# Patient Record
Sex: Male | Born: 1990 | Race: White | Hispanic: No | Marital: Single | Smoking: Current every day smoker
Health system: Southern US, Community
[De-identification: ages and names within clinical notes are randomized; demographics above are authoritative.]

---

## 2011-02-11 DIAGNOSIS — H409 Unspecified glaucoma: Secondary | ICD-10-CM | POA: Insufficient documentation

## 2011-02-11 DIAGNOSIS — R42 Dizziness and giddiness: Secondary | ICD-10-CM | POA: Insufficient documentation

## 2011-02-11 DIAGNOSIS — R51 Headache: Secondary | ICD-10-CM | POA: Insufficient documentation

## 2011-02-11 DIAGNOSIS — H5710 Ocular pain, unspecified eye: Secondary | ICD-10-CM | POA: Insufficient documentation

## 2011-02-11 DIAGNOSIS — H5460 Unqualified visual loss, one eye, unspecified: Secondary | ICD-10-CM | POA: Insufficient documentation

## 2011-02-12 ENCOUNTER — Other Ambulatory Visit: Payer: Self-pay

## 2011-02-12 ENCOUNTER — Emergency Department (HOSPITAL_COMMUNITY)
Admission: EM | Admit: 2011-02-12 | Discharge: 2011-02-12 | Disposition: A | Discharge status: Home / Self Care | Payer: Self-pay | Attending: Emergency Medicine | Admitting: Emergency Medicine

## 2011-02-12 ENCOUNTER — Emergency Department (HOSPITAL_COMMUNITY): Payer: Self-pay

## 2011-02-12 ENCOUNTER — Encounter: Payer: Self-pay | Admitting: *Deleted

## 2011-02-12 DIAGNOSIS — R51 Headache: Secondary | ICD-10-CM

## 2011-02-12 DIAGNOSIS — H547 Unspecified visual loss: Secondary | ICD-10-CM

## 2011-02-12 LAB — URINALYSIS, ROUTINE W REFLEX MICROSCOPIC
Bilirubin Urine: NEGATIVE
Leukocytes, UA: NEGATIVE
Nitrite: NEGATIVE
Specific Gravity, Urine: 1.028 (ref 1.005–1.030)
Urobilinogen, UA: 1 mg/dL (ref 0.0–1.0)
pH: 7 (ref 5.0–8.0)

## 2011-02-12 LAB — POCT I-STAT, CHEM 8
Chloride: 105 mEq/L (ref 96–112)
Glucose, Bld: 102 mg/dL — ABNORMAL HIGH (ref 70–99)
HCT: 46 % (ref 39.0–52.0)
Hemoglobin: 15.6 g/dL (ref 13.0–17.0)
Potassium: 4 mEq/L (ref 3.5–5.1)
Sodium: 143 mEq/L (ref 135–145)

## 2011-02-12 LAB — CBC
HCT: 43.6 % (ref 39.0–52.0)
Hemoglobin: 15.4 g/dL (ref 13.0–17.0)
RDW: 12.5 % (ref 11.5–15.5)
WBC: 6.9 10*3/uL (ref 4.0–10.5)

## 2011-02-12 LAB — DIFFERENTIAL
Basophils Absolute: 0 10*3/uL (ref 0.0–0.1)
Basophils Relative: 0 % (ref 0–1)
Lymphocytes Relative: 38 % (ref 12–46)
Monocytes Absolute: 0.6 10*3/uL (ref 0.1–1.0)
Monocytes Relative: 8 % (ref 3–12)
Neutro Abs: 3.5 10*3/uL (ref 1.7–7.7)
Neutrophils Relative %: 51 % (ref 43–77)

## 2011-02-12 MED ORDER — NAPROXEN 500 MG PO TABS: 500.0000 mg | ORAL_TABLET | Freq: Two times a day (BID) | ORAL | Status: DC

## 2011-02-12 NOTE — ED Provider Notes (Signed)
History     CSN: [id number] Arrival date & time: 02/12/2011 12:27 AM   First MD Initiated Contact with Patient 02/12/11 0042      Chief Complaint  Patient presents with  . 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  Patient is a 20 y.o. male presenting with eye pain.  Eye Pain This is a recurrent problem. Episode onset: 1 week ago. The problem occurs daily. The problem has not changed since onset.Associated symptoms include headaches. Pertinent negatives include no chest pain, no abdominal pain and no shortness of breath. Associated symptoms comments: Vertigo spells and headaches.. Exacerbated by: being around noises and lots of people. Relieved by: being isolated by self. He has tried nothing for the symptoms.    Past Medical History  Diagnosis Date  . Glaucoma     History reviewed. No pertinent past surgical history.  History reviewed. No pertinent family history.  History  Substance Use Topics  . Smoking status: Current Everyday Smoker  . Smokeless tobacco: Not on file  . Alcohol Use: No      Review of Systems  Eyes: Positive for pain.  Respiratory: Negative for shortness of breath.   Cardiovascular: Negative for chest pain.  Gastrointestinal: Negative for abdominal pain.  Neurological: Positive for headaches.  All other systems reviewed and are negative.    Allergies  Review of patient's allergies indicates no known allergies.  Home Medications   Current Outpatient Rx  Name Route Sig Dispense  Refill  . NAPROXEN 500 MG PO TABS Oral Take 1 tablet (500 mg total) by mouth 2 (two) times daily. 30 tablet 0    BP 112/66  Pulse 58  Temp(Src) 98 F (36.7 C) (Oral)  Resp 16  SpO2 98%  Physical Exam  Nursing note and vitals reviewed. Constitutional: He appears well-developed and well-nourished. No distress.  HENT:  Head: Normocephalic and atraumatic.  Mouth/Throat: Oropharynx is clear and moist. No oropharyngeal exudate.  Eyes: Right eye exhibits no discharge. Left eye exhibits no discharge. No scleral icterus.       R eye sclerotic and hypotrophied, L eye with normal appearnce, no conj injection, normal reactive pupil, normal EOM.  IOP is 13 X 3  Neck: Normal range of motion. Neck supple. No JVD present. No thyromegaly present.  Cardiovascular: Normal rate, regular rhythm, normal heart sounds and intact distal pulses.  Exam reveals no gallop and no friction rub.   No murmur heard. Pulmonary/Chest: Effort normal and breath sounds normal. No respiratory distress. He has no wheezes. He has no rales.  Abdominal: Soft. Bowel sounds are normal. He exhibits no distension and no mass. There is no tenderness.  Musculoskeletal: Normal range of motion. He exhibits no edema and no tenderness.  Lymphadenopathy:    He has no cervical adenopathy.  Neurological: He is alert. Coordination normal.  Skin: Skin is warm and dry. No rash noted. No erythema.  Psychiatric: He has a normal mood and affect. His behavior is normal.    ED Course  Procedures (  including critical care time)  Labs Reviewed  URINALYSIS, ROUTINE W REFLEX MICROSCOPIC - Abnormal; Notable for the following:    Appearance CLOUDY (*)    Ketones, ur 15 (*)    All other components within normal limits  POCT I-STAT, CHEM 8 - Abnormal; Notable for the following:    Glucose, Bld 102 (*)    All other components within normal limits  CBC  DIFFERENTIAL  I-STAT, CHEM 8   Ct Head Wo Contrast  02/12/2011  *RADIOLOGY REPORT*   Clinical Data: Persistent left eye pain.  CT HEAD WITHOUT CONTRAST  Technique:  Contiguous axial images were obtained from the base of the skull through the vertex without contrast.  Comparison: None.  Findings: There is no evidence of acute infarction, mass lesion, or intra- or extra-axial hemorrhage on CT.  The posterior fossa, including the cerebellum, brainstem and fourth ventricle, is within normal limits.  The third and lateral ventricles, and basal ganglia are unremarkable in appearance.  The cerebral hemispheres are symmetric in appearance, with normal gray- white differentiation.  No mass effect or midline shift is seen.  There is no evidence of fracture; visualized osseous structures are unremarkable in appearance.  Partial right-sided phthisis bulbi is noted.  The visualized portions of the left orbit are unremarkable. The paranasal sinuses and mastoid air cells are well-aerated.  No significant soft tissue abnormalities are seen.  IMPRESSION:  1.  No acute intracranial pathology seen. 2.  Partial right-sided phthisis bulbi noted; the visualized portions of the left orbit are unremarkable.  Original Report Authenticated By: Tonia Ghent, M.D.     1. Headache   2. Visual loss       MDM  Overall pt is well appearing, has normal IOP but HA and blackouts need w/u with CT head and labs.  Possibly related to anxiety and social situation.  Some vertigo per his report of spinning room and loss of balance intermittentlyl.      ED ECG REPORT   Date: 02/12/2011   Rate: 71   Rhythm: normal sinus rhythm  QRS Axis: normal  Intervals: normal  ST/T Wave abnormalities: nonspecific ST changes likely early repolarization  Conduction Disutrbances:none  Narrative Interpretation: unremarkable  Old EKG Reviewed: none available  Pt informed of results - no acute findings on CT and has normal vision with corrective lenses, no signs of acute glaucoma, no signs of stroke, well appearing otehrwise.   Optho f/u encouraged.  Vida Roller, MD 02/12/11 [phone]

## 2011-02-12 NOTE — ED Notes (Addendum)
[facility] from [facility], C/o L eye pain, onset ~ 2 weeks ago, also reports dizzy spells, vision black outs, floaters. Lost vision in R eye ~ 4 yrs ago, L eye sx are worse than R eye sx were then. Mild redness noted to L eye, pupil round reactive 5mm brisk. (R eye inflamed, drooping lid w/ possible drainage).   L eye pain 6/10.
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
## 2011-06-29 ENCOUNTER — Emergency Department (HOSPITAL_COMMUNITY)
Admission: EM | Admit: 2011-06-29 | Discharge: 2011-06-29 | Disposition: A | Discharge status: Home / Self Care | Payer: Self-pay | Attending: Emergency Medicine | Admitting: Emergency Medicine

## 2011-06-29 ENCOUNTER — Encounter (HOSPITAL_COMMUNITY): Payer: Self-pay | Admitting: *Deleted

## 2011-06-29 DIAGNOSIS — S61412A Laceration without foreign body of left hand, initial encounter: Secondary | ICD-10-CM

## 2011-06-29 DIAGNOSIS — W268XXA Contact with other sharp object(s), not elsewhere classified, initial encounter: Secondary | ICD-10-CM | POA: Insufficient documentation

## 2011-06-29 DIAGNOSIS — S61409A Unspecified open wound of unspecified hand, initial encounter: Secondary | ICD-10-CM | POA: Insufficient documentation

## 2011-06-29 DIAGNOSIS — F17200 Nicotine dependence, unspecified, uncomplicated: Secondary | ICD-10-CM | POA: Insufficient documentation

## 2011-06-29 NOTE — ED Notes (Signed)
Pt has a laceration to his left hand. He was running his hand along shelves at [employer] when this occurred.  No bleeding at this time.  Last tetanus unknown

## 2011-06-29 NOTE — ED Notes (Signed)
NP at bedside for lac repair

## 2011-06-29 NOTE — ED Provider Notes (Signed)
History     CSN: [id number]  Arrival date & time 06/29/11  1724   First MD Initiated Contact with Patient 06/29/11 1841      Chief Complaint  Patient presents with  . Extremity Laceration    (Consider location/radiation/quality/duration/timing/severity/associated sxs/prior treatment) Patient is a 21 y.o. male presenting with skin laceration. The history is provided by the patient. No language interpreter was used.  Laceration  The incident occurred 3 to 5 hours ago. The laceration is located on the left hand. The laceration is 2 cm in size. The laceration mechanism was a a metal edge. The pain is mild. The pain has been fluctuating since onset. He reports no foreign bodies present. His tetanus status is UTD.    Past Medical History  Diagnosis Date  . Glaucoma   . Glaucoma     History reviewed. No pertinent past surgical history.  No family history on file.  History  Substance Use Topics  . Smoking status: Current Everyday Smoker  . Smokeless tobacco: Not on file  . Alcohol Use: No      Review of Systems  Skin: Positive for wound.  All other systems reviewed and are negative.    Allergies  Review of patient's allergies indicates no known allergies.  Home Medications  No current outpatient prescriptions on file.  BP 134/65  Pulse 104  Temp(Src) 98.1 F (36.7 C) (Oral)  Resp 14  SpO2 98%  Physical Exam  Nursing note and vitals reviewed. Constitutional: He is oriented to person, place, and time. He appears well-developed and well-nourished.  HENT:  Head: Normocephalic and atraumatic.  Eyes: Conjunctivae are normal. Pupils are equal, round, and reactive to light.  Neck: Normal range of motion. Neck supple.  Cardiovascular: Normal rate, regular rhythm, normal heart sounds and intact distal pulses.   Pulmonary/Chest: Effort normal and breath sounds normal.  Abdominal: Soft. Bowel sounds are normal.  Musculoskeletal: Normal range of motion.       Left  hand: Decreased strength noted. He exhibits finger abduction and thumb/finger opposition.       Hands:      Laceration at base of thumb, palmer surface   Lymphadenopathy:    He has no cervical adenopathy.  Neurological: He is alert and oriented to person, place, and time.  Skin: Skin is warm and dry.  Psychiatric: He has a normal mood and affect. His behavior is normal. Judgment and thought content normal.    ED Course  Procedures (including critical care time)  Labs Reviewed - No data to display No results found.   No diagnosis found. LACERATION REPAIR Performed by: Jimmye Norman Authorized by: Jimmye Norman Consent: Verbal consent obtained. Risks and benefits: risks, benefits and alternatives were discussed Consent given by: patient Patient identity confirmed: provided demographic data Prepped and Draped in normal sterile fashion Wound explored  Laceration Location: left palm at base of thumb  Laceration Length: 2cm  No Foreign Bodies seen or palpated  Anesthesia: local infiltration  Local anesthetic: lidocaine 1%  Anesthetic total: 4 ml  Irrigation method: syringe Amount of cleaning: standard  Skin closure:  4.0 prolene  Number of sutures: 3  Technique: simple interrupted Patient tolerance: Patient tolerated the procedure well with no immediate complications.  Laceration palm of left hand at base of thumb. MDM          Jimmye Norman, NP 06/29/11 2006

## 2011-06-29 NOTE — ED Provider Notes (Signed)
Medical screening examination/treatment/procedure(s) were performed by non-physician practitioner and as supervising physician I was immediately available for consultation/collaboration.    Nelia Shi, MD 06/29/11 2300

## 2011-06-29 NOTE — Discharge Instructions (Signed)

## 2011-11-27 ENCOUNTER — Emergency Department (HOSPITAL_COMMUNITY)
Admission: EM | Admit: 2011-11-27 | Discharge: 2011-11-28 | Disposition: A | Discharge status: Rehabilitation Facility | Payer: Self-pay | Attending: Emergency Medicine | Admitting: Emergency Medicine

## 2011-11-27 ENCOUNTER — Encounter (HOSPITAL_COMMUNITY): Payer: Self-pay | Admitting: Emergency Medicine

## 2011-11-27 DIAGNOSIS — F1910 Other psychoactive substance abuse, uncomplicated: Secondary | ICD-10-CM

## 2011-11-27 DIAGNOSIS — F1120 Opioid dependence, uncomplicated: Secondary | ICD-10-CM | POA: Insufficient documentation

## 2011-11-27 DIAGNOSIS — F17200 Nicotine dependence, unspecified, uncomplicated: Secondary | ICD-10-CM | POA: Insufficient documentation

## 2011-11-27 DIAGNOSIS — H409 Unspecified glaucoma: Secondary | ICD-10-CM | POA: Insufficient documentation

## 2011-11-27 LAB — COMPREHENSIVE METABOLIC PANEL
ALT: 12 U/L (ref 0–53)
Alkaline Phosphatase: 67 U/L (ref 39–117)
BUN: 14 mg/dL (ref 6–23)
CO2: 27 mEq/L (ref 19–32)
Chloride: 104 mEq/L (ref 96–112)
GFR calc Af Amer: >90 mL/min (ref >90)
Glucose, Bld: 96 mg/dL (ref 70–99)
Potassium: 4.5 mEq/L (ref 3.5–5.1)
Sodium: 138 mEq/L (ref 135–145)
Total Bilirubin: 0.4 mg/dL (ref 0.3–1.2)
Total Protein: 7 g/dL (ref 6.0–8.3)

## 2011-11-27 LAB — CBC
HCT: 40.9 % (ref 39.0–52.0)
Hemoglobin: 14.1 g/dL (ref 13.0–17.0)
MCHC: 34.5 g/dL (ref 30.0–36.0)
RBC: 4.59 MIL/uL (ref 4.22–5.81)
WBC: 6.1 10*3/uL (ref 4.0–10.5)

## 2011-11-27 LAB — RAPID URINE DRUG SCREEN, HOSP PERFORMED
Barbiturates: NOT DETECTED
Benzodiazepines: NOT DETECTED

## 2011-11-27 LAB — ETHANOL: Alcohol, Ethyl (B): <11 mg/dL (ref 0–11)

## 2011-11-27 MED ORDER — ZOLPIDEM TARTRATE 5 MG PO TABS: 5.0000 mg | ORAL_TABLET | Freq: Every evening | ORAL | Status: DC | PRN

## 2011-11-27 MED ORDER — ALUM & MAG HYDROXIDE-SIMETH 200-200-20 MG/5ML PO SUSP: 30.0000 mL | ORAL | Status: DC | PRN

## 2011-11-27 MED ORDER — NICOTINE 21 MG/24HR TD PT24
21.0000 mg | MEDICATED_PATCH | Freq: Every day | TRANSDERMAL | Status: DC
  Administered 2011-11-27: 21 mg via TRANSDERMAL
  Filled 2011-11-27: qty 1

## 2011-11-27 MED ORDER — ONDANSETRON HCL 8 MG PO TABS: 4.0000 mg | ORAL_TABLET | Freq: Three times a day (TID) | ORAL | Status: DC | PRN

## 2011-11-27 MED ORDER — ACETAMINOPHEN 325 MG PO TABS
650.0000 mg | ORAL_TABLET | Freq: Once | ORAL | Status: AC
  Administered 2011-11-27: 650 mg via ORAL
  Filled 2011-11-27: qty 2

## 2011-11-27 NOTE — ED Notes (Signed)
Pt here requesting detox from heroin; pt sts last use was yesterday; pt denies other drug use and denies SI/HI

## 2011-11-27 NOTE — ED Notes (Signed)
Patient belongings placed in personal belongings bag and placed in paper scrubs then wanded by security

## 2011-11-27 NOTE — ED Provider Notes (Addendum)
History   This chart was scribed for Gerhard Munch, MD by Melba Coon. The patient was seen in room TR10C/TR10C and the patient's care was started at 6:07PM.    CSN: [id number]  Arrival date & time 11/27/11  1548   First MD Initiated Contact with Patient 11/27/11 1729      Chief Complaint  Patient presents with  . Medical Clearance  . Addiction Problem    (Consider location/radiation/quality/duration/timing/severity/associated sxs/prior treatment) The history is provided by the patient. No language interpreter was used.   Carlos Chapman is a 21 y.o. male who presents to the Emergency Department for a detox from heroine. 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 Pt took it thru IV for 6 months then switched to pills. Pt then quit cold [place]. However, pt states that he relapsed on IV heroine 2 weeks. His last heroine use was yesterday. Pt denies any other drug use. No SI or HI. Pt wants to go to [place]. No HA, fever, neck pain, sore throat, rash, back pain, CP, SOB, abd pain, n/v/d, dysuria, or extremity pain, edema, weakness, numbness, or tingling. Hx of glaucoma and bipolar disorder (doesn't take any meds). No known allergies. No other pertinent medical symptoms.   Past Medical History  Diagnosis Date  . Glaucoma   . Glaucoma     History reviewed. No pertinent past surgical history.  History reviewed. No pertinent family history.  History  Substance Use Topics  . Smoking status: Current Everyday Smoker  . Smokeless tobacco: Not on file  . Alcohol Use: No      Review of Systems 10 Systems reviewed and all are negative for acute change except as noted in the HPI.   Allergies  Review of patient's allergies indicates no known allergies.  Home Medications  No current outpatient prescriptions on file.  BP 127/57  Pulse 62  Temp 98.2 F (36.8 C) (Oral)  Resp 20  SpO2 98%  Physical Exam  Nursing note and vitals reviewed. Constitutional: He is oriented to  person, place, and time. He appears well-developed. No distress.  HENT:  Head: Normocephalic and atraumatic.  Eyes: Conjunctivae and EOM are normal.  Cardiovascular: Normal rate and regular rhythm.   Pulmonary/Chest: Effort normal. No stridor. No respiratory distress.  Abdominal: He exhibits no distension.  Musculoskeletal: He exhibits no edema.  Neurological: He is alert and oriented to person, place, and time.  Skin: Skin is warm and dry.       Multiple needle marks on both arms, w no findings suggestive of early cellulitis / abscess.  Psychiatric: He has a normal mood and affect. Judgment normal. Cognition and memory are normal. He expresses no suicidal ideation. He expresses no suicidal plans.    ED Course  Procedures (including critical care time)   COORDINATION OF CARE:  6:09PM - Blood w/u, UA, and drug screen panel will be ordered for the pt. Pt will speak with addiction counseling specialists.    Labs Reviewed  CBC  COMPREHENSIVE METABOLIC PANEL  ETHANOL  URINE RAPID DRUG SCREEN (HOSP PERFORMED)   No results found.   No diagnosis found.    MDM  I personally performed the services described in this documentation, which was scribed in my presence. The recorded information has been reviewed and considered.  This young male presents with request for assistance with heroin detoxification.  On exam the patient is in no distress.  There no concerning findings on his arms suggestive of early infection.  The patient  is medically clear for behavioral health evaluation  Gerhard Munch, MD 11/27/11 1820  Gerhard Munch, MD 11/27/11 Rickey Primus

## 2011-11-27 NOTE — ED Notes (Signed)
Pt requesting detox from Heroin, reports using for 4 years, pt reports he was "clean" x2 years and began using again 2 weeks ago, pt denies any new stressors that caused a relapse. Pt denies SI/HI. Pt reports constant nausea, pt last used Heroin at 6 pm yesterday. Pt reports using approx $60/day of IV Heroin

## 2011-11-28 NOTE — ED Notes (Signed)
Pt resting with lights out, no complaints voiced.
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
## 2011-11-28 NOTE — ED Notes (Signed)
[facility] called and they will pick up patient at 11am.

## 2012-07-04 IMAGING — CT CT HEAD W/O CM
1 of 2 series · 13 of 30 positions shown, 17 images · non-contrast
Comparison: None.

CLINICAL DATA: Persistent left eye pain.

CT HEAD WITHOUT CONTRAST
TECHNIQUE: Contiguous axial images were obtained from the base of
the skull through the vertex without contrast.

[Series 2: brain · axial · 0.49mm/px · z∈[+103,+236]mm · 13 of 32 slices shown, 17 images]
[im 3/32  brain]
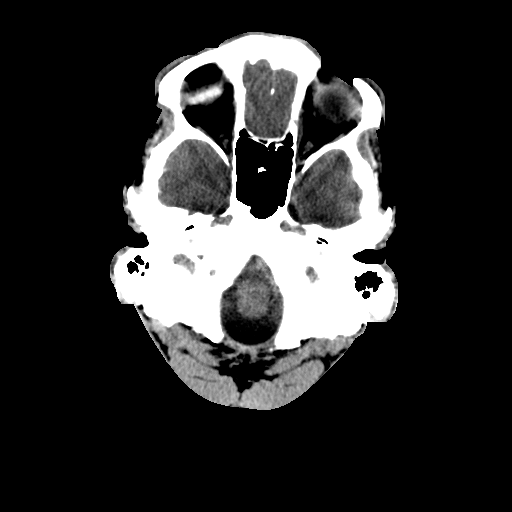
[im 3/32  bone]
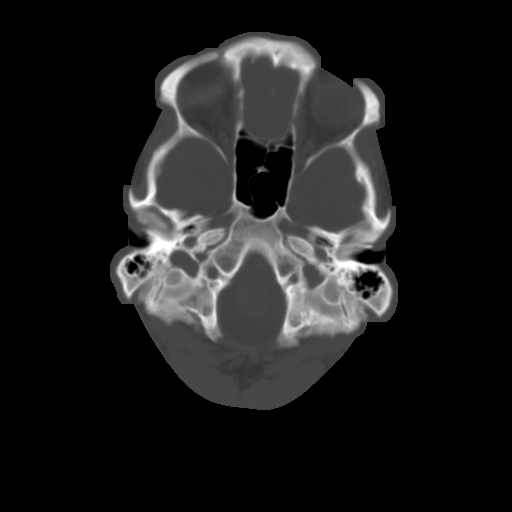
[im 5/32  brain]
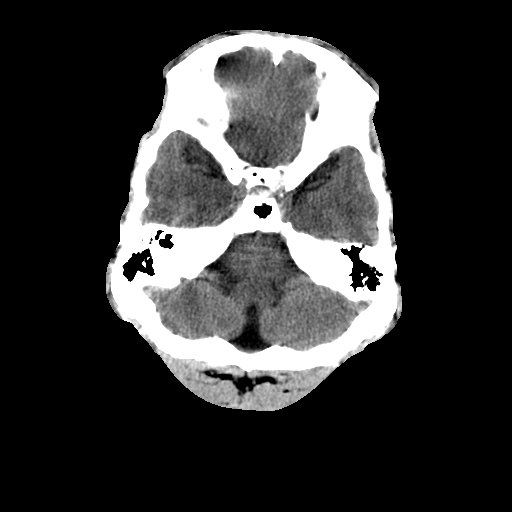
[im 7/32  brain]
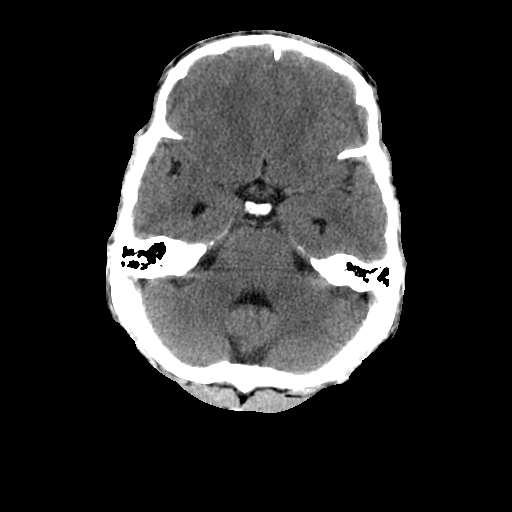
[im 9/32  brain]
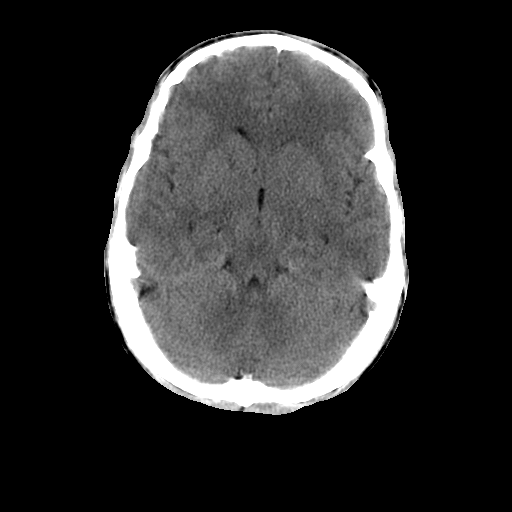
[im 12/32  brain]
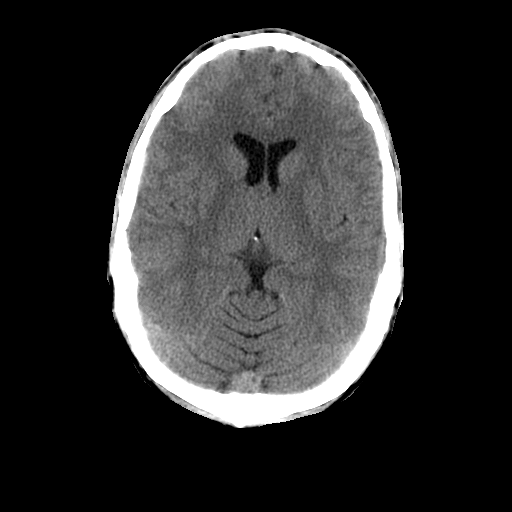
[im 12/32  bone]
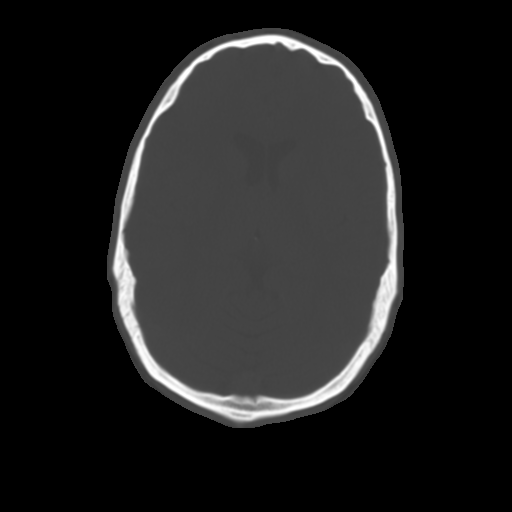
[im 14/32  brain]
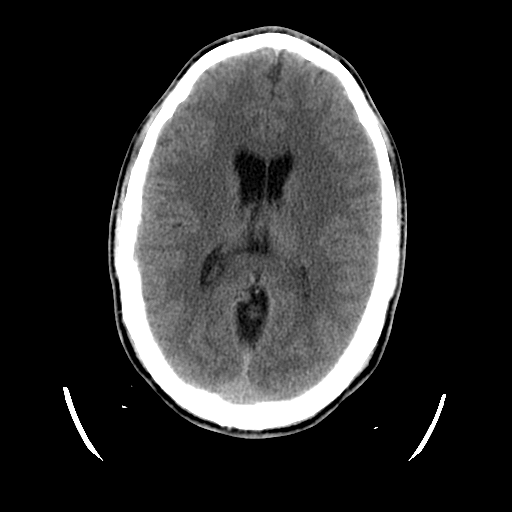
[im 16/32  brain]
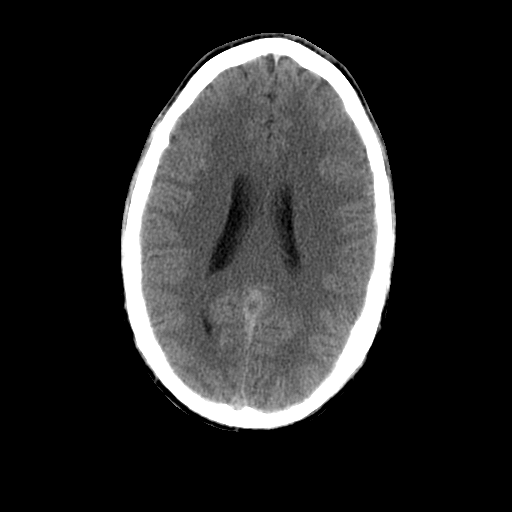
[im 18/32  brain]
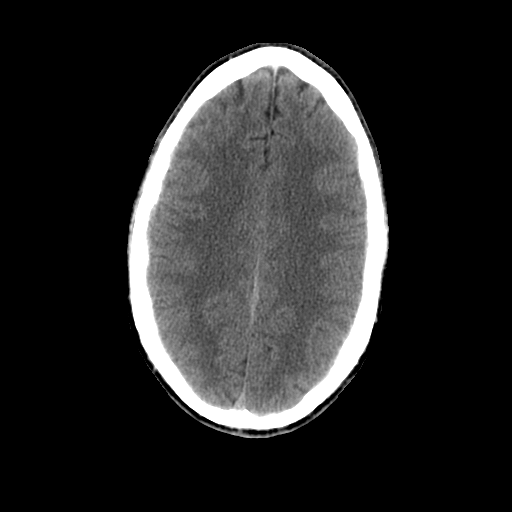
[im 20/32  brain]
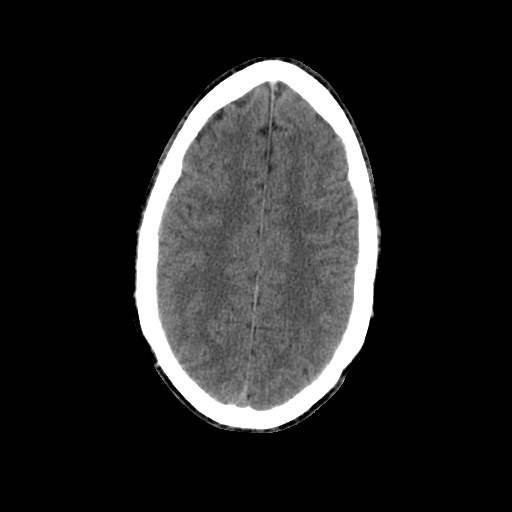
[im 20/32  bone]
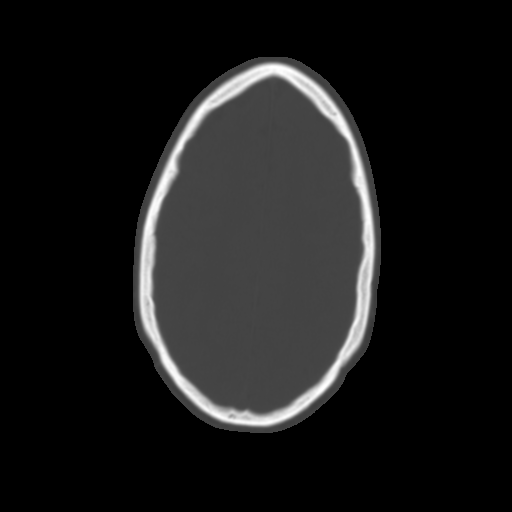
[im 23/32  brain]
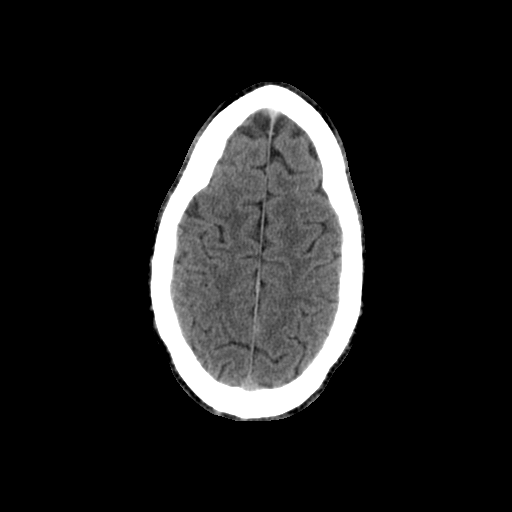
[im 25/32  brain]
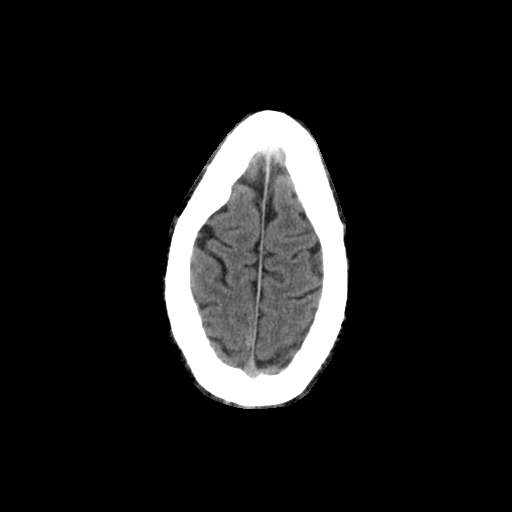
[im 27/32  brain]
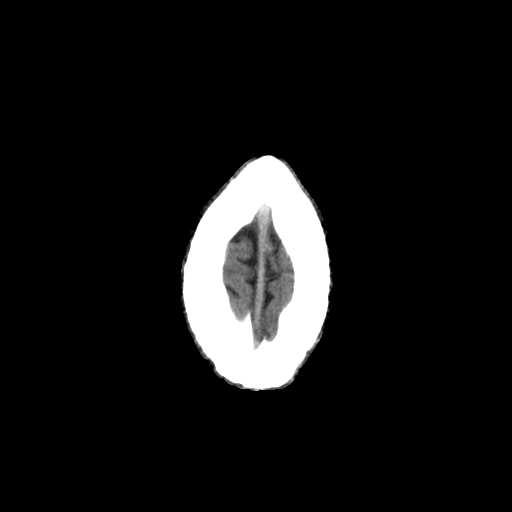
[im 29/32  brain]
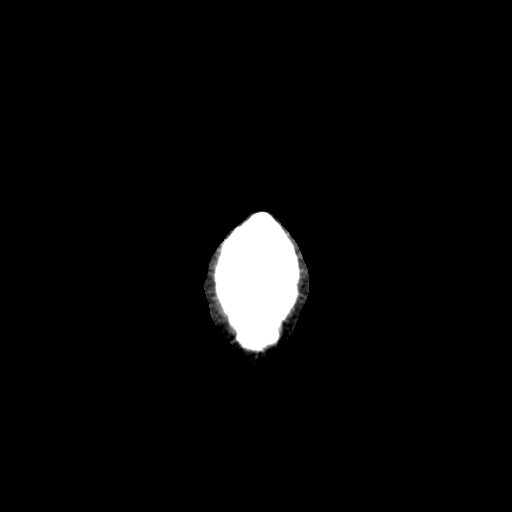
[im 29/32  bone]
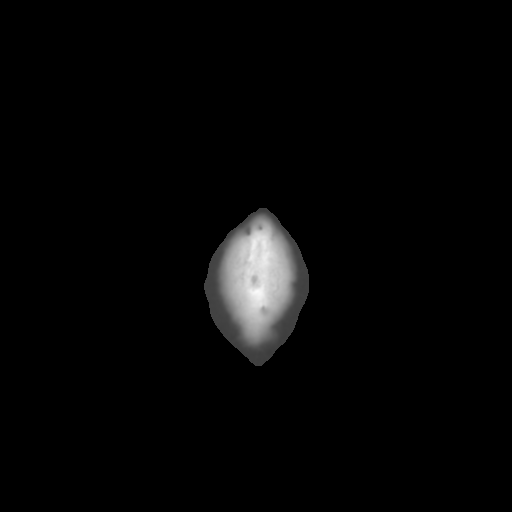

[13 of 30 positions shown; findings below may reference images not displayed]

FINDINGS: There is no evidence of acute infarction, mass lesion, or
intra- or extra-axial hemorrhage on CT.

The posterior fossa, including the cerebellum, brainstem and fourth
ventricle, is within normal limits.  The third and lateral
ventricles, and basal ganglia are unremarkable in appearance.  The
cerebral hemispheres are symmetric in appearance, with normal gray-
white differentiation.  No mass effect or midline shift is seen.

There is no evidence of fracture; visualized osseous structures are
unremarkable in appearance.  Partial right-sided phthisis bulbi is
noted.  The visualized portions of the left orbit are unremarkable.
The paranasal sinuses and mastoid air cells are well-aerated.  No
significant soft tissue abnormalities are seen.
IMPRESSION: 1.  No acute intracranial pathology seen.
2.  Partial right-sided phthisis bulbi noted; the visualized
portions of the left orbit are unremarkable.

## 2018-03-31 ENCOUNTER — Encounter (HOSPITAL_COMMUNITY): Payer: Self-pay | Admitting: Emergency Medicine

## 2018-03-31 ENCOUNTER — Other Ambulatory Visit: Payer: Self-pay

## 2018-03-31 ENCOUNTER — Emergency Department (HOSPITAL_COMMUNITY)
Admission: EM | Admit: 2018-03-31 | Discharge: 2018-03-31 | Disposition: A | Discharge status: Home / Self Care | Payer: Self-pay | Attending: Emergency Medicine | Admitting: Emergency Medicine

## 2018-03-31 DIAGNOSIS — R55 Syncope and collapse: Secondary | ICD-10-CM | POA: Insufficient documentation

## 2018-03-31 DIAGNOSIS — F17200 Nicotine dependence, unspecified, uncomplicated: Secondary | ICD-10-CM | POA: Insufficient documentation

## 2018-03-31 DIAGNOSIS — J069 Acute upper respiratory infection, unspecified: Secondary | ICD-10-CM | POA: Insufficient documentation

## 2018-03-31 LAB — URINALYSIS, ROUTINE W REFLEX MICROSCOPIC
Bilirubin Urine: NEGATIVE
GLUCOSE, UA: NEGATIVE mg/dL
HGB URINE DIPSTICK: NEGATIVE
KETONES UR: NEGATIVE mg/dL
LEUKOCYTES UA: NEGATIVE
Nitrite: NEGATIVE
PH: 6 (ref 5.0–8.0)
Protein, ur: NEGATIVE mg/dL
Specific Gravity, Urine: 1.003 — ABNORMAL LOW (ref 1.005–1.030)

## 2018-03-31 LAB — CBC WITH DIFFERENTIAL/PLATELET
Abs Immature Granulocytes: 0.02 10*3/uL (ref 0.00–0.07)
Basophils Absolute: 0 10*3/uL (ref 0.0–0.1)
Basophils Relative: 1 %
EOS ABS: 0.2 10*3/uL (ref 0.0–0.5)
EOS PCT: 3 %
HEMATOCRIT: 43.2 % (ref 39.0–52.0)
Hemoglobin: 14.6 g/dL (ref 13.0–17.0)
IMMATURE GRANULOCYTES: 0 %
LYMPHS ABS: 1.3 10*3/uL (ref 0.7–4.0)
Lymphocytes Relative: 16 %
MCH: 30.6 pg (ref 26.0–34.0)
MCHC: 33.8 g/dL (ref 30.0–36.0)
MCV: 90.6 fL (ref 80.0–100.0)
MONO ABS: 0.7 10*3/uL (ref 0.1–1.0)
MONOS PCT: 8 %
NEUTROS PCT: 72 %
Neutro Abs: 6.2 10*3/uL (ref 1.7–7.7)
Platelets: 197 10*3/uL (ref 150–400)
RBC: 4.77 MIL/uL (ref 4.22–5.81)
RDW: 12 % (ref 11.5–15.5)
WBC: 8.5 10*3/uL (ref 4.0–10.5)
nRBC: 0 % (ref 0.0–0.2)

## 2018-03-31 LAB — BASIC METABOLIC PANEL
ANION GAP: 8 (ref 5–15)
BUN: 11 mg/dL (ref 6–20)
CALCIUM: 8.7 mg/dL — AB (ref 8.9–10.3)
CO2: 23 mmol/L (ref 22–32)
Chloride: 106 mmol/L (ref 98–111)
Creatinine, Ser: 0.92 mg/dL (ref 0.61–1.24)
GFR calc Af Amer: >60 mL/min (ref >60)
GFR calc non Af Amer: >60 mL/min (ref >60)
GLUCOSE: 129 mg/dL — AB (ref 70–99)
Potassium: 3.5 mmol/L (ref 3.5–5.1)
Sodium: 137 mmol/L (ref 135–145)

## 2018-03-31 LAB — CBG MONITORING, ED: Glucose-Capillary: 112 mg/dL — ABNORMAL HIGH (ref 70–99)

## 2018-03-31 MED ORDER — OXYMETAZOLINE HCL 0.05 % NA SOLN
2.0000 | Freq: Two times a day (BID) | NASAL | Status: DC | PRN
  Administered 2018-03-31: 2 via NASAL
  Filled 2018-03-31: qty 15

## 2018-03-31 NOTE — ED Triage Notes (Signed)
Pt brought in by EMS for syncopal episode  Pt got up to and went to the bathroom and woke up on the floor outside his bedroom door  Pt has had cold symptoms for 2 days and has been taking OTC cold medications  Pt has hx of drug use but has been clean for 6 years  Denies any drug or alcohol use

## 2018-03-31 NOTE — ED Provider Notes (Signed)
[facility]-EMERGENCY DEPT Provider Note: Lowella DellJ. Lane Jakory Matsuo, MD, FACEP  CSN: [id number] MRN: [id number] ARRIVAL: 03/31/18 at 0128 ROOM: WA14/WA14   CHIEF COMPLAINT  Syncope   HISTORY OF PRESENT ILLNESS  03/31/18 3:32 AM Carlos FerrisBrandon Chapman is a 27 y.o. male who got up to go to the bathroom just prior to arrival.  After voiding he apparently passed out on his way back to bed as he woke up on the floor.  He recalls no prodromal symptoms.  He has had cold symptoms (malaise, cough, nasal congestion) for 2 days but has not been taking over-the-counter cold medications.  He denies any current drug or alcohol use.    Past Medical History:  Diagnosis Date  . Glaucoma     History reviewed. No pertinent surgical history.  No family history on file.  Social History   Tobacco Use  . Smoking status: Current Every Day Smoker  Substance Use Topics  . Alcohol use: No  . Drug use: Yes    Types: IV    Prior to Admission medications   Not on File    Allergies Bee venom and Other   REVIEW OF SYSTEMS  Negative except as noted here or in the History of Present Illness.   PHYSICAL EXAMINATION  Initial Vital Signs Blood pressure (!) 108/59, pulse 73, temperature 97.8 F (36.6 C), temperature source Oral, resp. rate (!) 25, height 6' (1.829 m), weight 79.4 kg, SpO2 97 %.  Examination General: Well-developed, well-nourished male in no acute distress; appearance consistent with age of record HENT: normocephalic; atraumatic; nasal congestion Eyes: Left pupil round and reactive to light; left extraocular muscles intact; atrophic, blind right eye Neck: supple Heart: regular rate and rhythm Lungs: clear to auscultation bilaterally Abdomen: soft; nondistended; nontender; bowel sounds present Extremities: No deformity; full range of motion; pulses normal Neurologic: Awake, alert and oriented; motor function intact in all extremities and symmetric; no facial droop Skin: Warm and dry Psychiatric: Normal  mood and affect   RESULTS  Summary of this visit's results, reviewed by myself:   EKG Interpretation  Date/Time:  Monday March 31 2018 01:51:02 EST Ventricular Rate:  82 PR Interval:    QRS Duration: 100 QT Interval:  343 QTC Calculation: 401 R Axis:   84 Text Interpretation:  Sinus rhythm Normal ECG No significant change was found Confirmed by Paula LibraMolpus, Ellise Kovack ([id number]) on 03/31/2018 3:32:04 AM      Laboratory Studies: Results for orders placed or performed during the hospital encounter of 03/31/18 (from the past 24 hour(s))  CBG monitoring, ED     Status: Abnormal   Collection Time: 03/31/18  1:35 AM  Result Value Ref Range   Glucose-Capillary 112 (H) 70 - 99 mg/dL  Urinalysis, Routine w reflex microscopic     Status: Abnormal   Collection Time: 03/31/18  1:51 AM  Result Value Ref Range   Color, Urine STRAW (A) YELLOW   APPearance CLEAR CLEAR   Specific Gravity, Urine 1.003 (L) 1.005 - 1.030   pH 6.0 5.0 - 8.0   Glucose, UA NEGATIVE NEGATIVE mg/dL   Hgb urine dipstick NEGATIVE NEGATIVE   Bilirubin Urine NEGATIVE NEGATIVE   Ketones, ur NEGATIVE NEGATIVE mg/dL   Protein, ur NEGATIVE NEGATIVE mg/dL   Nitrite NEGATIVE NEGATIVE   Leukocytes, UA NEGATIVE NEGATIVE  CBC with Differential/Platelet     Status: None   Collection Time: 03/31/18  1:52 AM  Result Value Ref Range   WBC 8.5 4.0 - 10.5 K/uL   RBC 4.77 4.22 -  5.81 MIL/uL   Hemoglobin 14.6 13.0 - 17.0 g/dL   HCT 78.243.2 95.639.0 - 21.352.0 %   MCV 90.6 80.0 - 100.0 fL   MCH 30.6 26.0 - 34.0 pg   MCHC 33.8 30.0 - 36.0 g/dL   RDW 08.612.0 57.811.5 - 46.915.5 %   Platelets 197 150 - 400 K/uL   nRBC 0.0 0.0 - 0.2 %   Neutrophils Relative % 72 %   Neutro Abs 6.2 1.7 - 7.7 K/uL   Lymphocytes Relative 16 %   Lymphs Abs 1.3 0.7 - 4.0 K/uL   Monocytes Relative 8 %   Monocytes Absolute 0.7 0.1 - 1.0 K/uL   Eosinophils Relative 3 %   Eosinophils Absolute 0.2 0.0 - 0.5 K/uL   Basophils Relative 1 %   Basophils Absolute 0.0 0.0 - 0.1 K/uL    Immature Granulocytes 0 %   Abs Immature Granulocytes 0.02 0.00 - 0.07 K/uL  Basic metabolic panel     Status: Abnormal   Collection Time: 03/31/18  1:52 AM  Result Value Ref Range   Sodium 137 135 - 145 mmol/L   Potassium 3.5 3.5 - 5.1 mmol/L   Chloride 106 98 - 111 mmol/L   CO2 23 22 - 32 mmol/L   Glucose, Bld 129 (H) 70 - 99 mg/dL   BUN 11 6 - 20 mg/dL   Creatinine, Ser 6.290.92 0.61 - 1.24 mg/dL   Calcium 8.7 (L) 8.9 - 10.3 mg/dL   GFR calc non Af Amer >60 >60 mL/min   GFR calc Af Amer >60 >60 mL/min   Anion gap 8 5 - 15   Imaging Studies: No results found.  ED COURSE and MDM  Nursing notes and initial vitals signs, including pulse oximetry, reviewed.  Vitals:   03/31/18 0129 03/31/18 0130 03/31/18 0200 03/31/18 0230  BP: 122/83 122/83 120/74 (!) 108/59  Pulse: 70 73 71 73  Resp: 16 18 (!) 21 (!) 25  Temp: 97.8 F (36.6 C)     TempSrc: Oral     SpO2: 97% 99% 97% 97%  Weight: 79.4 kg     Height: 6' (1.829 m)      The cause for his syncope is unclear.  His EKG and laboratory studies are unremarkable.  This may represent micturition syncope or possibly cough syncope associated with his current URI.  PROCEDURES    ED DIAGNOSES     ICD-10-CM   1. Syncope and collapse R55   2. Acute URI J06.9        Darra Rosa, Jonny RuizJohn, MD 03/31/18 [phone]

## 2018-03-31 NOTE — ED Notes (Signed)
Bed: [id number] Expected date:  Expected time:  Means of arrival:  Comments: 27 yo M  Syncopal episode

## 2018-10-22 ENCOUNTER — Emergency Department (HOSPITAL_COMMUNITY)
Admission: EM | Admit: 2018-10-22 | Discharge: 2018-10-22 | Disposition: A | Discharge status: Home / Self Care | Payer: Self-pay | Attending: Emergency Medicine | Admitting: Emergency Medicine

## 2018-10-22 ENCOUNTER — Other Ambulatory Visit: Payer: Self-pay

## 2018-10-22 ENCOUNTER — Encounter (HOSPITAL_COMMUNITY): Payer: Self-pay | Admitting: Obstetrics and Gynecology

## 2018-10-22 DIAGNOSIS — F17210 Nicotine dependence, cigarettes, uncomplicated: Secondary | ICD-10-CM | POA: Insufficient documentation

## 2018-10-22 DIAGNOSIS — R350 Frequency of micturition: Secondary | ICD-10-CM | POA: Insufficient documentation

## 2018-10-22 DIAGNOSIS — R1030 Lower abdominal pain, unspecified: Secondary | ICD-10-CM | POA: Insufficient documentation

## 2018-10-22 DIAGNOSIS — R3915 Urgency of urination: Secondary | ICD-10-CM | POA: Insufficient documentation

## 2018-10-22 DIAGNOSIS — R34 Anuria and oliguria: Secondary | ICD-10-CM | POA: Insufficient documentation

## 2018-10-22 DIAGNOSIS — R300 Dysuria: Secondary | ICD-10-CM | POA: Insufficient documentation

## 2018-10-22 LAB — URINALYSIS, ROUTINE W REFLEX MICROSCOPIC
Bacteria, UA: NONE SEEN
Bilirubin Urine: NEGATIVE
Glucose, UA: NEGATIVE mg/dL
Hgb urine dipstick: NEGATIVE
Ketones, ur: NEGATIVE mg/dL
Nitrite: NEGATIVE
Protein, ur: 30 mg/dL — AB
Specific Gravity, Urine: 1.026 (ref 1.005–1.030)
pH: 5 (ref 5.0–8.0)

## 2018-10-22 LAB — CBG MONITORING, ED: Glucose-Capillary: 100 mg/dL — ABNORMAL HIGH (ref 70–99)

## 2018-10-22 MED ORDER — CEPHALEXIN 500 MG PO CAPS: 500.0000 mg | ORAL_CAPSULE | Freq: Three times a day (TID) | ORAL | 0 refills | Status: AC

## 2018-10-22 NOTE — Discharge Instructions (Signed)
Follow-up with urology if symptoms unimproved.  Take antibiotics until completed.  Return to the emergency department for any worsening symptoms.

## 2018-10-22 NOTE — ED Triage Notes (Signed)
Pt reports difficult urination and states he has not been able to have good urination since last week. Pt reports he will have one good urination and then he can only dribble.  Pt denies d/c from the penis

## 2018-10-22 NOTE — ED Provider Notes (Signed)
[facility] DEPT Provider Note   CSN: [id number] Arrival date & time: 10/22/18  1448  History   Chief Complaint Chief Complaint  Patient presents with  . Dysuria   HPI Carlos Chapman is a 28 y.o. male with past medical history significant for glaucoma and right eye who presents for evaluation of dysuria.  Patient states he is able to have a full urination in the morning however throughout the day has the urge to urinate frequently.  Patient states he feels like when he urinates he gets out small amounts frequently.  Has had some intermittent suprapubic burning sensation 4 days ago however has not had any since.  Dates he is not been sexually active times years.  Denies fever, chills, nausea, vomiting, abdominal pain, testicular pain, penile discharge, penile rashes or lesions, testicular pain, pain with bowel movements, flank pain, history of nephrolithiasis.  Patient states he had something similar "years ago" and was diagnosed with a UTI.  Denies history of STDs.  Does not want to be tested for HIV or syphilis.  Has not taken anything for his symptoms.  Denies additional aggravating or alleviating factors.  Denies chronic medications or history of diabetes. Denies possibility of foreign body. No hx of stones.  History obtained from patient and past medical records.  No interpreter is used.     HPI  Past Medical History:  Diagnosis Date  . Glaucoma     There are no active problems to display for this patient.   History reviewed. No pertinent surgical history.      Home Medications    Prior to Admission medications   Medication Sig Start Date End Date Taking? Authorizing Provider  cephALEXin (KEFLEX) 500 MG capsule Take 1 capsule (500 mg total) by mouth 3 (three) times daily for 5 days. 10/22/18 10/27/18  Jelina Paulsen A, PA-C    Family History No family history on file.  Social History Social History   Tobacco Use  . Smoking status:  Current Every Day Smoker    Packs/day: 0.75    Types: Cigarettes  . Smokeless tobacco: Never Used  Substance Use Topics  . Alcohol use: No  . Drug use: Yes    Types: Marijuana     Allergies   Bee venom and Other   Review of Systems Review of Systems  Constitutional: Negative.   HENT: Negative.   Respiratory: Negative.   Cardiovascular: Negative.   Gastrointestinal: Negative.  Negative for abdominal distention, abdominal pain, blood in stool, constipation, diarrhea, nausea, rectal pain and vomiting.  Genitourinary: Positive for decreased urine volume, dysuria, frequency and urgency. Negative for difficulty urinating, discharge, enuresis, flank pain, hematuria, penile pain, penile swelling, scrotal swelling and testicular pain.  Musculoskeletal: Negative.   Skin: Negative.   Neurological: Negative.   All other systems reviewed and are negative.    Physical Exam Updated Vital Signs BP 123/75   Pulse 78   Temp 97.9 F (36.6 C)   Resp 14   SpO2 98%   Physical Exam Vitals signs and nursing note reviewed. Exam conducted with a chaperone present.  Constitutional:      General: He is not in acute distress.    Appearance: He is well-developed. He is not ill-appearing, toxic-appearing or diaphoretic.  HENT:     Head: Normocephalic and atraumatic.     Nose: Nose normal.     Mouth/Throat:     Mouth: Mucous membranes are moist.     Pharynx: Oropharynx is clear.  Eyes:  Pupils: Pupils are equal, round, and reactive to light.  Neck:     Musculoskeletal: Normal range of motion and neck supple.  Cardiovascular:     Rate and Rhythm: Normal rate and regular rhythm.     Pulses: Normal pulses.     Heart sounds: Normal heart sounds.  Pulmonary:     Effort: Pulmonary effort is normal. No respiratory distress.     Breath sounds: Normal breath sounds. No stridor. No wheezing or rhonchi.  Abdominal:     General: Bowel sounds are normal. There is no distension.     Palpations:  Abdomen is soft.     Tenderness: There is no abdominal tenderness. There is no right CVA tenderness, left CVA tenderness, guarding or rebound.     Hernia: No hernia is present.     Comments: Soft, nontender without rebound or guarding.  Negative CVA tap bilaterally.  No evidence of abdominal wall or inguinal hernias.  No overlying skin changes to abdominal wall.  Genitourinary:    Comments: Performed with male nurse in room.  Normal penis without rashes or discharge.  Testes normal.  Cremasteric reflex present.  No mass, tenderness, swelling.  No tenderness to epididymis.  No evidence of inguinal lymphadenopathy or hernia. Musculoskeletal: Normal range of motion.     Comments: Moves all 4 extremities without difficulty.  Skin:    General: Skin is warm and dry.     Comments: No rashes or lesions.  Brisk capillary refill.  Neurological:     Mental Status: He is alert.     Comments: Ambulatory in ED without difficulty.    ED Treatments / Results  Labs (all labs ordered are listed, but only abnormal results are displayed) Labs Reviewed  URINALYSIS, ROUTINE W REFLEX MICROSCOPIC - Abnormal; Notable for the following components:      Result Value   Color, Urine AMBER (*)    Protein, ur 30 (*)    Leukocytes,Ua TRACE (*)    All other components within normal limits  CBG MONITORING, ED - Abnormal; Notable for the following components:   Glucose-Capillary 100 (*)    All other components within normal limits  URINE CULTURE  GC/CHLAMYDIA PROBE AMP ([facility] [place]) NOT AT [facility]    EKG None  Radiology No results found.  Procedures Procedures (including critical care time)  Medications Ordered in ED Medications - No data to display  Initial Impression / Assessment and Plan / ED Course  I have reviewed the triage vital signs and the nursing notes.  Pertinent labs & imaging results that were available during my care of the patient were reviewed by me and considered in my medical decision  making (see chart for details).  28 year old male appears otherwise well presents for evaluation of dysuria and frequency x 1 week.  Afebrile, nonseptic, non-ill-appearing.  Patient without concern for STDs, has not been sexually active for "years."  No history of diabetes and is not on any chronic medicines.  He is able to empty his bladder however  has frequent urination with voiding of small amounts throughout the day.  No history of polyuria.  Abdomen soft, nontender without rebound or guarding.  GU exam without any significant findings.  No testicular or epididymis tenderness to suggest epididymitis.  No evidence of skin infection on exam.  No pain with bowel movements to suggest prostatitis.  Will Obtain urinalysis, GC/chlamydia and reevaluate.  Discussed with patient possible CT stone to check for obstruction, hernia, enlarge prostate however patient declines.  Discussed risk versus benefit however patient declines imaging at this time.  Given benign exam feel this is reasonable.  Declines prostate exam.  No pain to suggest nephrolithiasis.  No evidence of hernia on exam.  Patient is afebrile without abdominal tenderness, abdominal pain.  No tenderness to palpation of the testes or epididymis to suggest orchitis or epididymitis.  STD cultures obtained including gonorrhea and chlamydia. Declines HIV, RPR. Urinalysis with leukocytes without nitrate or bacteria.  Will culture.  GC/chlamydia pending.  Discussed empiric treatment antibiotics for GC/chlamydia however patient declines.  Given urinalysis with leukocytes will DC home with antibiotics to cover for cystitis.  Patient follow-up with PCP or urology if his symptoms are unresolved.  Patient abdomen soft, nontender without rebound or guarding.  Nonsurgical abdomen.  He is tolerating p.o. intake without difficulty.  The patient has been appropriately medically screened and/or stabilized in the ED. I have low suspicion for any other emergent medical  condition which would require further screening, evaluation or treatment in the ED or require inpatient management.  Patient is hemodynamically stable and in no acute distress.  Patient able to ambulate in department prior to ED.  Evaluation does not show acute pathology that would require ongoing or additional emergent interventions while in the emergency department or further inpatient treatment.  I have discussed the diagnosis with the patient and answered all questions.  Pain is been managed while in the emergency department and patient has no further complaints prior to discharge.  Patient is comfortable with plan discussed in room and is stable for discharge at this time.  I have discussed strict return precautions for returning to the emergency department.  Patient was encouraged to follow-up with PCP/specialist refer to at discharge.     Final Clinical Impressions(s) / ED Diagnoses   Final diagnoses:  Dysuria    ED Discharge Orders         Ordered    cephALEXin (KEFLEX) 500 MG capsule  3 times daily     10/22/18 1835           Stormi Vandevelde A, PA-C 10/22/18 1837    Pricilla LovelessGoldston, Scott, MD 10/23/18 1225

## 2018-10-23 LAB — GC/CHLAMYDIA PROBE AMP (~~LOC~~) NOT AT ARMC
Chlamydia: NEGATIVE
Neisseria Gonorrhea: NEGATIVE

## 2018-10-23 LAB — URINE CULTURE

## 2023-12-16 ENCOUNTER — Emergency Department (HOSPITAL_COMMUNITY): Payer: Self-pay

## 2023-12-16 ENCOUNTER — Emergency Department (HOSPITAL_COMMUNITY)
Admission: EM | Admit: 2023-12-16 | Discharge: 2023-12-16 | Disposition: A | Discharge status: Home / Self Care | Payer: Self-pay

## 2023-12-16 DIAGNOSIS — W228XXA Striking against or struck by other objects, initial encounter: Secondary | ICD-10-CM | POA: Insufficient documentation

## 2023-12-16 DIAGNOSIS — R55 Syncope and collapse: Secondary | ICD-10-CM | POA: Insufficient documentation

## 2023-12-16 DIAGNOSIS — S00211A Abrasion of right eyelid and periocular area, initial encounter: Secondary | ICD-10-CM | POA: Insufficient documentation

## 2023-12-16 DIAGNOSIS — S0083XA Contusion of other part of head, initial encounter: Secondary | ICD-10-CM | POA: Insufficient documentation

## 2023-12-16 LAB — COMPREHENSIVE METABOLIC PANEL WITH GFR
ALT: 24 U/L (ref 0–44)
AST: 18 U/L (ref 15–41)
Albumin: 3.5 g/dL (ref 3.5–5.0)
Alkaline Phosphatase: 40 U/L (ref 38–126)
Anion gap: 7 (ref 5–15)
BUN: 14 mg/dL (ref 6–20)
CO2: 21 mmol/L — ABNORMAL LOW (ref 22–32)
Calcium: 8.4 mg/dL — ABNORMAL LOW (ref 8.9–10.3)
Chloride: 111 mmol/L (ref 98–111)
Creatinine, Ser: 0.84 mg/dL (ref 0.61–1.24)
GFR, Estimated: >60 mL/min (ref >60)
Glucose, Bld: 113 mg/dL — ABNORMAL HIGH (ref 70–99)
Potassium: 4.2 mmol/L (ref 3.5–5.1)
Sodium: 139 mmol/L (ref 135–145)
Total Bilirubin: 0.6 mg/dL (ref 0.0–1.2)
Total Protein: 5.7 g/dL — ABNORMAL LOW (ref 6.5–8.1)

## 2023-12-16 LAB — I-STAT CHEM 8, ED
BUN: 14 mg/dL (ref 6–20)
Calcium, Ion: 1.18 mmol/L (ref 1.15–1.40)
Chloride: 109 mmol/L (ref 98–111)
Creatinine, Ser: 0.8 mg/dL (ref 0.61–1.24)
Glucose, Bld: 110 mg/dL — ABNORMAL HIGH (ref 70–99)
HCT: 38 % — ABNORMAL LOW (ref 39.0–52.0)
Hemoglobin: 12.9 g/dL — ABNORMAL LOW (ref 13.0–17.0)
Potassium: 4.1 mmol/L (ref 3.5–5.1)
Sodium: 142 mmol/L (ref 135–145)
TCO2: 21 mmol/L — ABNORMAL LOW (ref 22–32)

## 2023-12-16 LAB — CBC
HCT: 41.8 % (ref 39.0–52.0)
Hemoglobin: 14 g/dL (ref 13.0–17.0)
MCH: 30.3 pg (ref 26.0–34.0)
MCHC: 33.5 g/dL (ref 30.0–36.0)
MCV: 90.5 fL (ref 80.0–100.0)
Platelets: 210 K/uL (ref 150–400)
RBC: 4.62 MIL/uL (ref 4.22–5.81)
RDW: 12.6 % (ref 11.5–15.5)
WBC: 7.1 K/uL (ref 4.0–10.5)
nRBC: 0 % (ref 0.0–0.2)

## 2023-12-16 LAB — URINALYSIS, ROUTINE W REFLEX MICROSCOPIC
Bilirubin Urine: NEGATIVE
Glucose, UA: NEGATIVE mg/dL
Hgb urine dipstick: NEGATIVE
Ketones, ur: NEGATIVE mg/dL
Leukocytes,Ua: NEGATIVE
Nitrite: NEGATIVE
Protein, ur: NEGATIVE mg/dL
Specific Gravity, Urine: 1.014 (ref 1.005–1.030)
pH: 6 (ref 5.0–8.0)

## 2023-12-16 LAB — TROPONIN I (HIGH SENSITIVITY)
Troponin I (High Sensitivity): 4 ng/L (ref <18)
Troponin I (High Sensitivity): 7 ng/L (ref <18)

## 2023-12-16 LAB — CBG MONITORING, ED: Glucose-Capillary: 106 mg/dL — ABNORMAL HIGH (ref 70–99)

## 2023-12-16 MED ORDER — SODIUM CHLORIDE 0.9 % IV BOLUS
500.0000 mL | Freq: Once | INTRAVENOUS | Status: AC
  Administered 2023-12-16: 500 mL via INTRAVENOUS

## 2023-12-16 NOTE — ED Triage Notes (Signed)
 Pt BIB EMS from home, got up this morning to go to restroom and had a syncopal episodes. Woke up on the floor of the bathroom, hit head by right eye. Obvious bruising and swelling. On EMS arrival pt had no radial pulses, HR in 40's, and unpalpable BP. Pt had 2 more syncopal episodes witnessed by EMS. Pt pale, clammy, shaky. 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

## 2023-12-16 NOTE — ED Notes (Signed)
 CCMD called to place the patient on cardiac monitoring services.

## 2023-12-16 NOTE — Discharge Instructions (Signed)
 Call and follow-up with your primary care doctor.  A referral was placed for cardiology, recommend following up with them.  They should call the office to schedule the appointment.

## 2023-12-16 NOTE — ED Notes (Signed)
 Help get patient into a gown on the monitor did EKG patient has call bell in reach

## 2023-12-16 NOTE — ED Provider Notes (Signed)
 [place] EMERGENCY DEPARTMENT AT [facility] Provider Note   CSN: [id number] Arrival date & time: 12/16/23  1228     Patient presents with: Loss of Consciousness   Carlos Chapman is a 33 y.o. male.   33 year old male presents for evaluation of syncopal episode.  States he got up to go to the bathroom this morning and had a syncopal episode and hit his head on the floor.  EMS states he had 2 more syncopal episodes all of them.  On their arrival he was bradycardic and hypotensive.  Patient states he is feeling somewhat better now.  He got 500 cc of IV fluids.  Denies any chest pain but states he did have 1 episode of vomiting after EMS got there.  Has not been ill recently.  Denies any other symptoms or concerns.  He has no cardiac history.  States when he was younger occasionally he would fade.   Loss of Consciousness Associated symptoms: no chest pain, no fever, no palpitations, no seizures, no shortness of breath and no vomiting        Prior to Admission medications   Not on File    Allergies: Bee venom and Other    Review of Systems  Constitutional:  Positive for fatigue. Negative for chills and fever.  HENT:  Negative for ear pain and sore throat.   Eyes:  Negative for pain and visual disturbance.  Respiratory:  Negative for cough and shortness of breath.   Cardiovascular:  Positive for syncope. Negative for chest pain and palpitations.  Gastrointestinal:  Negative for abdominal pain and vomiting.  Genitourinary:  Negative for dysuria and hematuria.  Musculoskeletal:  Negative for arthralgias and back pain.  Skin:  Negative for color change and rash.  Neurological:  Positive for light-headedness. Negative for seizures and syncope.  All other systems reviewed and are negative.   Updated Vital Signs BP (!) 119/59 (BP Location: Right Arm)   Pulse (!) 55   Temp 97.9 F (36.6 C) (Oral)   Resp 14   Ht 5' 11 (1.803 m)   Wt 77.1 kg   SpO2 100%   BMI 23.71  kg/m   Physical Exam Vitals and nursing note reviewed.  Constitutional:      General: He is not in acute distress.    Appearance: Normal appearance. He is well-developed. He is not ill-appearing.  HENT:     Head: Normocephalic.     Comments: Right forehead with contusion and some mild swelling and abrasion just lateral to the right eye Eyes:     Conjunctiva/sclera: Conjunctivae normal.     Comments: Right eye is chronically abnormal, patient is unable to open it, history of glaucoma  Cardiovascular:     Rate and Rhythm: Normal rate and regular rhythm.     Heart sounds: No murmur heard. Pulmonary:     Effort: Pulmonary effort is normal. No respiratory distress.     Breath sounds: Normal breath sounds.  Abdominal:     Palpations: Abdomen is soft.     Tenderness: There is no abdominal tenderness.  Musculoskeletal:        General: No swelling.     Cervical back: Neck supple.  Skin:    General: Skin is warm and dry.     Capillary Refill: Capillary refill takes less than 2 seconds.  Neurological:     Mental Status: He is alert.  Psychiatric:        Mood and Affect: Mood normal.     (  all labs ordered are listed, but only abnormal results are displayed) Labs Reviewed  COMPREHENSIVE METABOLIC PANEL WITH GFR - Abnormal; Notable for the following components:      Result Value   CO2 21 (*)    Glucose, Bld 113 (*)    Calcium 8.4 (*)    Total Protein 5.7 (*)    All other components within normal limits  CBG MONITORING, ED - Abnormal; Notable for the following components:   Glucose-Capillary 106 (*)    All other components within normal limits  I-STAT CHEM 8, ED - Abnormal; Notable for the following components:   Glucose, Bld 110 (*)    TCO2 21 (*)    Hemoglobin 12.9 (*)    HCT 38.0 (*)    All other components within normal limits  CBC  URINALYSIS, ROUTINE W REFLEX MICROSCOPIC  TROPONIN I (HIGH SENSITIVITY)  TROPONIN I (HIGH SENSITIVITY)    EKG: EKG  Interpretation Date/Time:  Monday December 16 2023 12:35:15 EDT Ventricular Rate:  53 PR Interval:  160 QRS Duration:  96 QT Interval:  405 QTC Calculation: 381 R Axis:   86  Text Interpretation: Sinus rhythm Anterolateral ST elevation, probable early repolarization Compared with EKG from 03/31/2018 Confirmed by Gennaro Bouchard ([id number]) on 12/16/2023 12:51:49 PM  Radiology: CT Cervical Spine Wo Contrast Result Date: 12/16/2023 CLINICAL DATA:  hit head, neck pain EXAM: CT CERVICAL SPINE WITHOUT CONTRAST TECHNIQUE: Multidetector CT imaging of the cervical spine was performed without intravenous contrast. Multiplanar CT image reconstructions were also generated. RADIATION DOSE REDUCTION: This exam was performed according to the departmental dose-optimization program which includes automated exposure control, adjustment of the mA and/or kV according to patient size and/or use of iterative reconstruction technique. COMPARISON:  None Available. FINDINGS: Alignment: Normal Skull base and vertebrae: No acute fracture. No primary bone lesion or focal pathologic process. Soft tissues and spinal canal: No prevertebral fluid or swelling. No visible canal hematoma. Disc levels:  Normal Upper chest: No acute findings Other: None IMPRESSION: No acute bony abnormality. Electronically Signed   By: Franky Crease M.D.   On: 12/16/2023 14:30   CT HEAD WO CONTRAST Result Date: 12/16/2023 CLINICAL DATA:  Head trauma, GCS=15, scalp hematoma (Ped 0-1y) EXAM: CT HEAD WITHOUT CONTRAST TECHNIQUE: Contiguous axial images were obtained from the base of the skull through the vertex without intravenous contrast. RADIATION DOSE REDUCTION: This exam was performed according to the departmental dose-optimization program which includes automated exposure control, adjustment of the mA and/or kV according to patient size and/or use of iterative reconstruction technique. COMPARISON:  02/12/2011 FINDINGS: Brain: No acute intracranial  abnormality. Specifically, no hemorrhage, hydrocephalus, mass lesion, acute infarction, or significant intracranial injury. Vascular: No hyperdense vessel or unexpected calcification. Skull: No acute calvarial abnormality. Sinuses/Orbits: No acute findings Other: None IMPRESSION: Normal study. Electronically Signed   By: Franky Crease M.D.   On: 12/16/2023 14:28     Procedures   Medications Ordered in the ED  sodium chloride  0.9 % bolus 500 mL (500 mLs Intravenous New Bag/Given 12/16/23 1255)                                    Medical Decision Making Cardiac monitor interpretation: Sinus bradycardia with sinus rhythm, no ectopy  Social determinants of health: Patient currently has no primary care doctor  Patient here for syncopal episode.  On arrival he is feeling much better and no longer bradycardic  or hypotensive like he was for EMS.  We gave him another bolus of fluids after they gave him 1.  Troponins negative x 2 and lab workup unremarkable.  Offered him admission, but he is requesting to go home.  I think this is reasonable.  Will put in an ambulatory referral for cardiology and he is advised close follow-up with primary care and otherwise return for new or worsening symptoms.  He feels comfortable being discharged.  Problems Addressed: Syncope, unspecified syncope type: acute illness or injury  Amount and/or Complexity of Data Reviewed External Data Reviewed: notes.    Details: Prior ED records reviewed patient last seen in the ER 5 years ago for UTI Labs: ordered. Decision-making details documented in ED Course.    Details: Ordered and reviewed by me and troponins negative x 2, other lab workup is unremarkable Radiology: ordered and independent interpretation performed. Decision-making details documented in ED Course.    Details: Ordered and interpreted by me independently of radiology CT head: Shows no acute abnormality CT C-spine: Shows no acute bony abnormality Chest x-ray:  Shows no acute abnormality ECG/medicine tests: ordered and independent interpretation performed. Decision-making details documented in ED Course.    Details: Ordered and inter by me in the absence of cardiology and shows sinus bradycardia, no STEMI  Risk OTC drugs. Prescription drug management. Drug therapy requiring intensive monitoring for toxicity. Diagnosis or treatment significantly limited by social determinants of health.     Final diagnoses:  Syncope, unspecified syncope type    ED Discharge Orders          Ordered    Ambulatory referral to Cardiology       Comments: Syncope, bradycardia at times   12/16/23 1642               Patti Shorb, [place] L, DO 12/16/23 1649
